# Patient Record
Sex: Male | Born: 2000 | Race: White | Hispanic: No | Marital: Single | Smoking: Never smoker
Health system: Southern US, Community
[De-identification: ages and names within clinical notes are randomized; demographics above are authoritative.]

## PROBLEM LIST (undated history)

## (undated) DIAGNOSIS — T7840XA Allergy, unspecified, initial encounter: Secondary | ICD-10-CM

## (undated) DIAGNOSIS — Z889 Allergy status to unspecified drugs, medicaments and biological substances status: Secondary | ICD-10-CM

## (undated) DIAGNOSIS — J45909 Unspecified asthma, uncomplicated: Secondary | ICD-10-CM

## (undated) DIAGNOSIS — G47419 Narcolepsy without cataplexy: Secondary | ICD-10-CM

## (undated) HISTORY — DX: Allergy, unspecified, initial encounter: T78.40XA

## (undated) HISTORY — PX: TONSILLECTOMY: SUR1361

## (undated) HISTORY — PX: TYMPANOSTOMY TUBE PLACEMENT: SHX32

---
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
## 2023-02-22 DIAGNOSIS — R1033 Periumbilical pain: Secondary | ICD-10-CM | POA: Diagnosis not present

## 2023-02-22 DIAGNOSIS — R160 Hepatomegaly, not elsewhere classified: Secondary | ICD-10-CM | POA: Diagnosis not present

## 2023-02-22 DIAGNOSIS — Z7951 Long term (current) use of inhaled steroids: Secondary | ICD-10-CM | POA: Diagnosis not present

## 2023-02-22 DIAGNOSIS — K5730 Diverticulosis of large intestine without perforation or abscess without bleeding: Secondary | ICD-10-CM | POA: Diagnosis not present

## 2023-02-22 DIAGNOSIS — J45909 Unspecified asthma, uncomplicated: Secondary | ICD-10-CM | POA: Diagnosis not present

## 2023-02-22 DIAGNOSIS — K429 Umbilical hernia without obstruction or gangrene: Secondary | ICD-10-CM | POA: Diagnosis not present

## 2023-02-23 DIAGNOSIS — K5730 Diverticulosis of large intestine without perforation or abscess without bleeding: Secondary | ICD-10-CM | POA: Diagnosis not present

## 2023-02-23 DIAGNOSIS — R160 Hepatomegaly, not elsewhere classified: Secondary | ICD-10-CM | POA: Diagnosis not present

## 2023-02-23 DIAGNOSIS — K429 Umbilical hernia without obstruction or gangrene: Secondary | ICD-10-CM | POA: Diagnosis not present

## 2023-02-26 DIAGNOSIS — K429 Umbilical hernia without obstruction or gangrene: Secondary | ICD-10-CM | POA: Diagnosis not present

## 2023-02-27 ENCOUNTER — Ambulatory Visit: Payer: Medicaid Other | Admitting: Family Medicine

## 2023-02-27 ENCOUNTER — Encounter: Payer: Self-pay | Admitting: Family Medicine

## 2023-02-27 VITALS — BP 113/73 | HR 78 | Temp 98.7°F | Ht 71.5 in | Wt 294.0 lb

## 2023-02-27 DIAGNOSIS — J4520 Mild intermittent asthma, uncomplicated: Secondary | ICD-10-CM

## 2023-02-27 DIAGNOSIS — K429 Umbilical hernia without obstruction or gangrene: Secondary | ICD-10-CM

## 2023-02-27 DIAGNOSIS — K219 Gastro-esophageal reflux disease without esophagitis: Secondary | ICD-10-CM | POA: Diagnosis not present

## 2023-02-27 DIAGNOSIS — K5790 Diverticulosis of intestine, part unspecified, without perforation or abscess without bleeding: Secondary | ICD-10-CM | POA: Diagnosis not present

## 2023-02-27 DIAGNOSIS — K760 Fatty (change of) liver, not elsewhere classified: Secondary | ICD-10-CM | POA: Diagnosis not present

## 2023-02-27 DIAGNOSIS — Z0001 Encounter for general adult medical examination with abnormal findings: Secondary | ICD-10-CM

## 2023-02-27 MED ORDER — ALBUTEROL SULFATE HFA 108 (90 BASE) MCG/ACT IN AERS: 2.0000 | INHALATION_SPRAY | Freq: Four times a day (QID) | RESPIRATORY_TRACT | 11 refills | Status: AC | PRN

## 2023-02-27 MED ORDER — PANTOPRAZOLE SODIUM 40 MG PO TBEC: 40.0000 mg | DELAYED_RELEASE_TABLET | Freq: Every day | ORAL | 2 refills | Status: AC

## 2023-02-27 NOTE — Progress Notes (Signed)
New Patient Office Visit  Subjective   Patient ID: Craig Ledvina., male    DOB: 2000-11-28  Age: 23 y.o. MRN: [id number]  CC:  Chief Complaint  Patient presents with   New Patient (Initial Visit)    Establish care, well check    HPI Craig Rich. presents to establish care States that he has not been seen in several years and would like to establish.  Denies any concerns. States that he is having hernia repair surgery next week.   GERD  Currently not on medication.  States that he has burning, pain, sore throat/acid in his throat. Hurts to swollen during episode. Takes tums for symptoms. States that it sometimes works.  Current medications - none Dysphagia or dyspepsia - yes  Water brash - yes  Red Flags (weight loss, hematochezia, melena, weight loss, early satiety, fevers, odynophagia, or persistent vomiting) - Denies   Asthma  Has shortness of breath with heat and triggered by pollen. Denies nighttime awakenings. States that he   Occupation: Holiday representative  Marital status: none  Diet: chicken, baked and fried.  Seafood as well  Very limited green vegetables  Exercise: work, sometimes outside of work  Substance use: vaping  Started at [age]  Last eye exam: completed 2 weeks ago  Last dental exam: due - has appt next week.  [facility] in [place]  Last colonoscopy: no due  PSA: none  Refills needed today: none  Other specialists seen: surgeon for hernia  Dermatology exam: declines  Fasting today:  no  Immunizations needed: Flu Vaccine: declines   Tdap Vaccine: due   - every 20yrs - (<3 lifetime doses or unknown): all wounds -- look up need for Tetanus IG - (>=3 lifetime doses): clean/minor wound if >31yrs from previous; all other wounds if >72yrs from previous Zoster Vaccine: not due  (those >50yo, once) Pneumonia Vaccine: not due  (those w/ risk factors) - (<38yr) Both: Immunocompromised, cochlear implant, CSF leak, asplenic, sickle cell,  Chronic Renal Failure - (<41yr) PPSV-23 only: Heart dz, lung disease, DM, tobacco abuse, alcoholism, cirrhosis/liver disease. - (>16yr): PPSV13 then PPSV23 in 6-12mths;  - (>48yr): repeat PPSV23 once if pt received prior to 21yo and 42yrs have passed   Outpatient Encounter Medications as of 02/27/2023  Medication Sig   naproxen (NAPROSYN) 500 MG tablet Take by mouth.   oxyCODONE-acetaminophen (PERCOCET/ROXICET) 5-325 MG tablet Take by mouth.   [DISCONTINUED] albuterol (PROVENTIL) (2.5 MG/3ML) 0.083% nebulizer solution NEBULIZE 1 VIAL EVERY 6 HOURS AS NEEDED FOR SHORTNESS OF BREATH   [DISCONTINUED] amphetamine-dextroamphetamine (ADDERALL) 10 MG tablet Take 1 tablet (10 mg total) by mouth daily with breakfast.   [DISCONTINUED] amphetamine-dextroamphetamine (ADDERALL) 10 MG tablet Take 1 tablet (10 mg total) by mouth daily with breakfast.   [DISCONTINUED] amphetamine-dextroamphetamine (ADDERALL) 10 MG tablet Take 1 tablet (10 mg total) by mouth daily with breakfast.   [DISCONTINUED] cetirizine (ZYRTEC) 10 MG tablet Take 10 mg by mouth daily.   [DISCONTINUED] fluticasone (FLONASE) 50 MCG/ACT nasal spray USE 1 TO 2 SPRAYS IN EACH NOSTRIL DAILY   [DISCONTINUED] fluticasone (FLOVENT HFA) 110 MCG/ACT inhaler USE 2 PUFFS TWICE DAILY   [DISCONTINUED] montelukast (SINGULAIR) 10 MG tablet Take 1 tablet (10 mg total) by mouth every evening.   [DISCONTINUED] sulfamethoxazole-trimethoprim (BACTRIM DS) 800-160 MG tablet Take 1 tablet by mouth 2 (two) times daily.   No facility-administered encounter medications on file as of 02/27/2023.    Past Medical History:  Diagnosis Date   Allergy  Asthma    H/O seasonal allergies    Narcolepsy     Past Surgical History:  Procedure Laterality Date   TONSILLECTOMY     TYMPANOSTOMY TUBE PLACEMENT     TYMPANOSTOMY TUBE PLACEMENT      Family History  Problem Relation Age of Onset   Depression Mother    Hyperlipidemia Mother    Arthritis Father     Hyperlipidemia Father    Mental illness Brother    Depression Maternal Aunt    Hyperlipidemia Maternal Aunt    Hypertension Maternal Aunt     Social History   Socioeconomic History   Marital status: Single    Spouse name: Not on file   Number of children: Not on file   Years of education: Not on file   Highest education level: Not on file  Occupational History   Not on file  Tobacco Use   Smoking status: Never    Passive exposure: Yes   Smokeless tobacco: Never  Vaping Use   Vaping status: Every Day   Substances: Nicotine  Substance and Sexual Activity   Alcohol use: No   Drug use: No   Sexual activity: Not on file  Other Topics Concern   Not on file  Social History Narrative   Not on file   Social Drivers of Health   Financial Resource Strain: Not on file  Food Insecurity: Not on file  Transportation Needs: Not on file  Physical Activity: Not on file  Stress: Not on file  Social Connections: Unknown (05/29/2021)   Received from [facility]   Social Network    Social Network: Not on file  Intimate Partner Violence: Unknown (04/20/2021)   Received from [facility]   HITS    Physically Hurt: Not on file    Insult or Talk Down To: Not on file    Threaten Physical Harm: Not on file    Scream or Curse: Not on file    ROS As per HPI   Objective   BP 113/73   Pulse 78   Temp 98.7 F (37.1 C)   Ht 5' 11.5" (1.816 m)   Wt 294 lb (133.4 kg)   SpO2 96%   BMI 40.43 kg/m   Physical Exam Constitutional:      General: He is awake. He is not in acute distress.    Appearance: Normal appearance. He is well-developed and well-groomed. He is morbidly obese. He is not ill-appearing, toxic-appearing or diaphoretic.     Interventions: He is not intubated. HENT:     Head:     Salivary Glands: Right salivary gland is not diffusely enlarged or tender. Left salivary gland is not diffusely enlarged or tender.     Right Ear: No laceration, drainage, swelling or  tenderness. No middle ear effusion. There is no impacted cerumen. No foreign body. No mastoid tenderness. A PE tube is present. No hemotympanum. Tympanic membrane is injected and erythematous. Tympanic membrane is not scarred, perforated, retracted or bulging.     Left Ear: No laceration, drainage, swelling or tenderness. A middle ear effusion is present. There is no impacted cerumen. No foreign body. No mastoid tenderness. No PE tube. No hemotympanum. Tympanic membrane is not injected, scarred, perforated, erythematous, retracted or bulging.     Nose: No nasal deformity, septal deviation, signs of injury, laceration, nasal tenderness, mucosal edema, congestion or rhinorrhea.     Right Sinus: No maxillary sinus tenderness or frontal sinus tenderness.     Left Sinus:  No maxillary sinus tenderness or frontal sinus tenderness.  Eyes:     General: Lids are normal.     Extraocular Movements:     Right eye: Normal extraocular motion.     Left eye: Normal extraocular motion.     Conjunctiva/sclera:     Right eye: Right conjunctiva is not injected. No chemosis, exudate or hemorrhage.    Left eye: Left conjunctiva is not injected. No chemosis, exudate or hemorrhage.    Pupils: Pupils are equal, round, and reactive to light. Pupils are equal.     Right eye: Pupil is round, reactive and not sluggish. No corneal abrasion.     Left eye: Pupil is round, reactive and not sluggish. No corneal abrasion.     Funduscopic exam:    Right eye: No hemorrhage or exudate. Red reflex present.        Left eye: No hemorrhage or exudate. Red reflex present. Neck:     Thyroid: No thyroid mass or thyromegaly.     Vascular: No carotid bruit.     Trachea: Trachea and phonation normal. No tracheal tenderness, tracheostomy or tracheal deviation.  Cardiovascular:     Rate and Rhythm: Normal rate and regular rhythm.     Pulses: Normal pulses.          Radial pulses are 2+ on the right side and 2+ on the left side.        Posterior tibial pulses are 2+ on the right side and 2+ on the left side.     Heart sounds: Normal heart sounds. No murmur heard.    No gallop.  Pulmonary:     Effort: Pulmonary effort is normal. No tachypnea, bradypnea, accessory muscle usage, prolonged expiration, respiratory distress or retractions. He is not intubated.     Breath sounds: Normal breath sounds. No stridor, decreased air movement or transmitted upper airway sounds. No decreased breath sounds, wheezing, rhonchi or rales.  Abdominal:     General: Abdomen is flat. Bowel sounds are normal. There is no distension or abdominal bruit. There are no signs of injury.     Palpations: Abdomen is soft. There is no shifting dullness, fluid wave, hepatomegaly, splenomegaly, mass or pulsatile mass.     Tenderness: There is no abdominal tenderness. There is no right CVA tenderness, left CVA tenderness, guarding or rebound.     Hernia: A hernia is present. Hernia is present in the umbilical area.  Musculoskeletal:     Cervical back: Full passive range of motion without pain, normal range of motion and neck supple. No edema, erythema, rigidity, torticollis or crepitus. No pain with movement. Normal range of motion.     Right lower leg: No edema.     Left lower leg: No edema.  Lymphadenopathy:     Head:     Right side of head: No submental, submandibular, tonsillar, preauricular or posterior auricular adenopathy.     Left side of head: No submental, submandibular, tonsillar, preauricular or posterior auricular adenopathy.     Cervical: No cervical adenopathy.     Right cervical: No superficial, deep or posterior cervical adenopathy.    Left cervical: No superficial, deep or posterior cervical adenopathy.  Skin:    General: Skin is warm.     Capillary Refill: Capillary refill takes less than 2 seconds.  Neurological:     General: No focal deficit present.     Mental Status: He is alert, oriented to person, place, and time and easily aroused.  Mental status  is at baseline.     GCS: GCS eye subscore is 4. GCS verbal subscore is 5. GCS motor subscore is 6.     Cranial Nerves: Cranial nerves 2-12 are intact. No cranial nerve deficit or facial asymmetry.     Motor: Motor function is intact. No weakness.     Gait: Gait is intact.  Psychiatric:        Attention and Perception: Attention and perception normal.        Mood and Affect: Mood and affect normal.        Speech: Speech normal.        Behavior: Behavior normal. Behavior is cooperative.        Thought Content: Thought content normal. Thought content does not include homicidal or suicidal ideation. Thought content does not include homicidal or suicidal plan.        Cognition and Memory: Cognition and memory normal.        Judgment: Judgment normal.       02/27/2023    3:08 PM 11/27/2018   12:04 PM 08/26/2018   11:53 AM  Depression screen PHQ 2/9  Decreased Interest 0 0 0  Down, Depressed, Hopeless 0 0 0  PHQ - 2 Score 0 0 0  Altered sleeping 0 1   Tired, decreased energy 0 1   Change in appetite 0 0   Feeling bad or failure about yourself  0 0   Trouble concentrating 0 0   Moving slowly or fidgety/restless 0 0   Suicidal thoughts 0 0   PHQ-9 Score 0 2   Difficult doing work/chores Not difficult at all Not difficult at all       02/27/2023    3:08 PM  GAD 7 : Generalized Anxiety Score  Nervous, Anxious, on Edge 0  Control/stop worrying 0  Worry too much - different things 0  Trouble relaxing 0  Restless 0  Easily annoyed or irritable 0  Afraid - awful might happen 0  Total GAD 7 Score 0  Anxiety Difficulty Not difficult at all   Assessment & Plan:  1. Encounter for general adult medical examination with abnormal findings (Primary) Discussed with patient to continue healthy lifestyle choices, including diet (rich in fruits, vegetables, and lean proteins, and low in salt and simple carbohydrates) and exercise (at least 30 minutes of moderate physical activity  daily). Limit beverages high is sugar. Recommended at least 80-100 oz of water daily.  Mother and patient agree that he does not have ADHD. 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 Records not in chart, will inquire of development/behavioral evaluation.   2. Gastroesophageal reflux disease without esophagitis Not at goal. Will trial PPI as below.  - pantoprazole (PROTONIX) 40 MG tablet; Take 1 tablet (40 mg total) by mouth daily.  Dispense: 30 tablet; Refill: 2  3. Fatty liver Per report below, steatosis or fatty liver found on imaging. Recommend diet and exercise. We will work to improve blood pressure control, weight, and cholesterol. Recommend avoiding liver toxins such as alcohol and medications eliminated by the liver like tylenol. Notify the office if there are worsening symptoms of liver cirrhosis such as blood in your bowel movements or vomit, symptoms of infection, belly pain, swollen legs or ankles, trouble breathing, extreme tiredness, confusion, yellowing of the skin or whites of your eyes, called jaundice.  CT Abdomen Pelvis W IV Contrast  Impression 1. Small umbilical fat hernia with edema in the herniated fat.  The edema could indicate an incarcerated fat hernia but this can also be seen on a chronic basis. 2. Small inguinal fat hernias without edema in the fat. 3. Hepatomegaly and moderate hepatic steatosis. 4. Uncomplicated sigmoid diverticulosis. 5. Shotty subcentimeter mesenteric lymph nodes in the root fat and right lower quadrant, nonspecific. Electronically Signed   By: Almira Bar M.D.   On: 02/23/2023 03:11  4. Diverticulosis As above. Will continue to monitor.   5. Umbilical hernia without obstruction and without gangrene Patient to complete surgery next week.   6. Mild intermittent asthma without complication Well controlled. Will refill medication as below.  - albuterol (VENTOLIN HFA) 108 (90 Base) MCG/ACT inhaler;  Inhale 2 puffs into the lungs every 6 (six) hours as needed for wheezing or shortness of breath.  Dispense: 8 g; Refill: 11  7. Morbid obesity (HCC) As above.   The above assessment and management plan was discussed with the patient. The patient verbalized understanding of and has agreed to the management plan using shared-decision making. Patient is aware to call the clinic if they develop any new symptoms or if symptoms fail to improve or worsen. Patient is aware when to return to the clinic for a follow-up visit. Patient educated on when it is appropriate to go to the emergency department.   Return in about 6 months (around 08/27/2023) for Chronic Condition Follow up.   Neale Burly, DNP-FNP [facility] [address] [place], [place] [address] [phone]

## 2023-02-27 NOTE — Patient Instructions (Signed)
Per report, steatosis or fatty liver found on imaging. Recommend diet and exercise. We will work to improve blood pressure control, weight, and cholesterol. Recommend avoiding liver toxins such as alcohol and medications eliminated by the liver like tylenol. Notify the office if there are worsening symptoms of liver cirrhosis such as blood in your bowel movements or vomit, symptoms of infection, belly pain, swollen legs or ankles, trouble breathing, extreme tiredness, confusion, yellowing of the skin or whites of your eyes, called jaundice.

## 2023-03-02 DIAGNOSIS — H5213 Myopia, bilateral: Secondary | ICD-10-CM | POA: Diagnosis not present

## 2023-03-04 DIAGNOSIS — K429 Umbilical hernia without obstruction or gangrene: Secondary | ICD-10-CM | POA: Diagnosis not present

## 2023-03-04 DIAGNOSIS — K4090 Unilateral inguinal hernia, without obstruction or gangrene, not specified as recurrent: Secondary | ICD-10-CM | POA: Diagnosis not present

## 2023-03-19 DIAGNOSIS — K429 Umbilical hernia without obstruction or gangrene: Secondary | ICD-10-CM | POA: Diagnosis not present

## 2023-04-02 DIAGNOSIS — K429 Umbilical hernia without obstruction or gangrene: Secondary | ICD-10-CM | POA: Diagnosis not present

## 2023-04-30 DIAGNOSIS — K429 Umbilical hernia without obstruction or gangrene: Secondary | ICD-10-CM | POA: Diagnosis not present

## 2023-05-22 ENCOUNTER — Encounter: Payer: Self-pay | Admitting: *Deleted

## 2023-06-13 DIAGNOSIS — J209 Acute bronchitis, unspecified: Secondary | ICD-10-CM | POA: Diagnosis not present

## 2023-06-13 DIAGNOSIS — J069 Acute upper respiratory infection, unspecified: Secondary | ICD-10-CM | POA: Diagnosis not present

## 2023-06-13 DIAGNOSIS — J029 Acute pharyngitis, unspecified: Secondary | ICD-10-CM | POA: Diagnosis not present

## 2023-07-21 DIAGNOSIS — R1084 Generalized abdominal pain: Secondary | ICD-10-CM | POA: Diagnosis not present

## 2023-07-28 DIAGNOSIS — K760 Fatty (change of) liver, not elsewhere classified: Secondary | ICD-10-CM | POA: Diagnosis not present

## 2023-07-28 DIAGNOSIS — K429 Umbilical hernia without obstruction or gangrene: Secondary | ICD-10-CM | POA: Diagnosis not present

## 2023-07-28 DIAGNOSIS — R1033 Periumbilical pain: Secondary | ICD-10-CM | POA: Diagnosis not present

## 2023-08-25 NOTE — Progress Notes (Deleted)
 Established Patient Office Visit  Subjective  Patient ID: Craig Dipaola., male    DOB: [birth year]  Age: 23 y.o. MRN: [id number]  No chief complaint on file.   HPI Craig Rich. Patient Active Problem List   Diagnosis Date Noted   Morbid obesity (HCC) 02/27/2023   Mild intermittent asthma without complication 02/27/2023   Diverticulosis 02/27/2023   Gastroesophageal reflux disease without esophagitis 02/27/2023   Encounter for general adult medical examination with abnormal findings 02/27/2023   Fatty liver 02/27/2023   Umbilical hernia without obstruction and without gangrene 02/26/2023   Lumbar pain 11/19/2016   Anxiety state 11/22/2015   Past Medical History:  Diagnosis Date   Allergy    Asthma    H/O seasonal allergies    Narcolepsy    Past Surgical History:  Procedure Laterality Date   TONSILLECTOMY     TYMPANOSTOMY TUBE PLACEMENT     TYMPANOSTOMY TUBE PLACEMENT     Social History   Tobacco Use   Smoking status: Never    Passive exposure: Yes   Smokeless tobacco: Never  Vaping Use   Vaping status: Every Day   Substances: Nicotine  Substance Use Topics   Alcohol use: No   Drug use: No   Social History   Socioeconomic History   Marital status: Single    Spouse name: Not on file   Number of children: Not on file   Years of education: Not on file   Highest education level: Not on file  Occupational History   Not on file  Tobacco Use   Smoking status: Never    Passive exposure: Yes   Smokeless tobacco: Never  Vaping Use   Vaping status: Every Day   Substances: Nicotine  Substance and Sexual Activity   Alcohol use: No   Drug use: No   Sexual activity: Not on file  Other Topics Concern   Not on file  Social History Narrative   Not on file   Social Drivers of Health   Financial Resource Strain: Not on file  Food Insecurity: Not on file  Transportation Needs: Not on file  Physical Activity: Not on file  Stress: Not on file  Social  Connections: Unknown (05/29/2021)   Received from [facility]   Social Network    Social Network: Not on file  Intimate Partner Violence: Unknown (04/20/2021)   Received from [facility]   HITS    Physically Hurt: Not on file    Insult or Talk Down To: Not on file    Threaten Physical Harm: Not on file    Scream or Curse: Not on file   Family Status  Relation Name Status   Mother  (Not Specified)   Father  (Not Specified)   Brother  (Not Specified)   Mat Aunt  (Not Specified)  No partnership data on file   Family History  Problem Relation Age of Onset   Depression Mother    Hyperlipidemia Mother    Arthritis Father    Hyperlipidemia Father    Mental illness Brother    Depression Maternal Aunt    Hyperlipidemia Maternal Aunt    Hypertension Maternal Aunt    Allergies  Allergen Reactions   Prednisone  Anaphylaxis   Red Dye #40 (Allura Red) Other (See Comments)    Have nightmares      ROS Negative unless indicated in HPI   Objective:     There were no vitals taken for this visit. BP Readings from Last  3 Encounters:  02/27/23 113/73  10/06/20 128/77  11/27/18 119/80 (60%, Z = 0.25 /  88%, Z = 1.17)*   *BP percentiles are based on the 2017 AAP Clinical Practice Guideline for boys   Wt Readings from Last 3 Encounters:  02/27/23 294 lb (133.4 kg)  10/06/20 266 lb 0.1 oz (120.7 kg) (>99%, Z= 2.66)*  11/27/18 249 lb 6.4 oz (113.1 kg) (>99%, Z= 2.69)*   * Growth percentiles are based on CDC (Boys, 2-20 Years) data.      Physical Exam   No results found for any visits on 08/27/23.        Assessment & Plan:  There are no diagnoses linked to this encounter. Continue healthy lifestyle choices, including diet (rich in fruits, vegetables, and lean proteins, and low in salt and simple carbohydrates) and exercise (at least 30 minutes of moderate physical activity daily).     The above assessment and management plan was discussed with the patient. The patient  verbalized understanding of and has agreed to the management plan. Patient is aware to call the clinic if they develop any new symptoms or if symptoms persist or worsen. Patient is aware when to return to the clinic for a follow-up visit. Patient educated on when it is appropriate to go to the emergency department.  No follow-ups on file.    Camiya Vinal [place], DNP [facility] [address] [place], [place] [address] [phone]    Note: This document was prepared by [employer] voice dictation technology and any errors that results from this process are unintentional.

## 2023-08-27 ENCOUNTER — Encounter: Payer: Self-pay | Admitting: Family Medicine

## 2023-08-27 ENCOUNTER — Ambulatory Visit: Payer: Medicaid Other | Admitting: Family Medicine

## 2023-08-27 ENCOUNTER — Ambulatory Visit: Admitting: Nurse Practitioner

## 2023-09-25 DIAGNOSIS — H9211 Otorrhea, right ear: Secondary | ICD-10-CM | POA: Diagnosis not present

## 2023-10-21 DIAGNOSIS — H9211 Otorrhea, right ear: Secondary | ICD-10-CM | POA: Diagnosis not present

## 2023-10-21 DIAGNOSIS — H6991 Unspecified Eustachian tube disorder, right ear: Secondary | ICD-10-CM | POA: Diagnosis not present
